# Patient Record
Sex: Male | Born: 1990 | Race: White | Hispanic: No | Marital: Married | Smoking: Never smoker
Health system: Southern US, Community
[De-identification: ages and names within clinical notes are randomized; demographics above are authoritative.]

---
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
## 2019-01-23 ENCOUNTER — Ambulatory Visit: Payer: Self-pay | Admitting: Internal Medicine

## 2019-06-25 ENCOUNTER — Other Ambulatory Visit: Payer: Self-pay | Admitting: *Deleted

## 2019-06-25 DIAGNOSIS — Z20822 Contact with and (suspected) exposure to covid-19: Secondary | ICD-10-CM

## 2019-06-25 NOTE — Addendum Note (Signed)
Addended by: Anacaren Kohan M on: 06/25/2019 08:44 PM   Modules accepted: Orders  

## 2019-06-27 LAB — NOVEL CORONAVIRUS, NAA: SARS-CoV-2, NAA: NOT DETECTED

## 2019-07-31 ENCOUNTER — Other Ambulatory Visit: Payer: Self-pay

## 2019-07-31 ENCOUNTER — Ambulatory Visit (LOCAL_COMMUNITY_HEALTH_CENTER): Payer: Managed Care, Other (non HMO)

## 2019-07-31 DIAGNOSIS — Z0184 Encounter for antibody response examination: Secondary | ICD-10-CM

## 2019-07-31 DIAGNOSIS — Z23 Encounter for immunization: Secondary | ICD-10-CM

## 2019-07-31 NOTE — Progress Notes (Signed)
Presents to Nurse Clinic for Hepatitis B titer Quantitative and Varicella titer. ROI signed and client counseled typcially takes 24 - 48 hours for results to be available and he will receive call when they are available for pick up. Due to deadline for hospital clinical which must be met regarding Hepatitis B vaccine administration if non-immune, client requests Heplisav-B vaccine today as can complete Hepatitis B vaccine series in 28 days if nnon-immune and thus satisfy Hepatitis B requirement. Client counseled that vaccine costis $147.00 and results available in 24 - 48 hours. Client requests to proceed as unable to locate proof or prior Hepatits B vaccination and none in [facility]. Rich Number, RN

## 2019-08-01 LAB — VARICELLA ZOSTER ANTIBODY, IGG: Varicella zoster IgG: 204 index (ref >165)

## 2019-08-01 LAB — HEPATITIS B SURFACE ANTIBODY, QUANTITATIVE: Hepatitis B Surf Ab Quant: 4 m[IU]/mL — ABNORMAL LOW (ref >9.9)

## 2019-08-07 ENCOUNTER — Other Ambulatory Visit: Payer: Self-pay

## 2019-08-07 ENCOUNTER — Ambulatory Visit (LOCAL_COMMUNITY_HEALTH_CENTER): Payer: Managed Care, Other (non HMO)

## 2019-08-07 DIAGNOSIS — Z111 Encounter for screening for respiratory tuberculosis: Secondary | ICD-10-CM

## 2019-08-10 ENCOUNTER — Other Ambulatory Visit: Payer: Self-pay

## 2019-08-10 ENCOUNTER — Ambulatory Visit (LOCAL_COMMUNITY_HEALTH_CENTER): Payer: Managed Care, Other (non HMO)

## 2019-08-10 DIAGNOSIS — Z111 Encounter for screening for respiratory tuberculosis: Secondary | ICD-10-CM

## 2019-08-10 LAB — TB SKIN TEST
Induration: 0 mm
TB Skin Test: NEGATIVE

## 2019-08-21 ENCOUNTER — Ambulatory Visit (LOCAL_COMMUNITY_HEALTH_CENTER): Payer: Managed Care, Other (non HMO)

## 2019-08-21 ENCOUNTER — Other Ambulatory Visit: Payer: Self-pay

## 2019-08-21 DIAGNOSIS — Z111 Encounter for screening for respiratory tuberculosis: Secondary | ICD-10-CM

## 2019-08-24 ENCOUNTER — Ambulatory Visit (LOCAL_COMMUNITY_HEALTH_CENTER): Payer: Managed Care, Other (non HMO)

## 2019-08-24 ENCOUNTER — Other Ambulatory Visit: Payer: Self-pay

## 2019-08-24 DIAGNOSIS — Z111 Encounter for screening for respiratory tuberculosis: Secondary | ICD-10-CM

## 2019-08-24 LAB — TB SKIN TEST
Induration: 0 mm
TB Skin Test: NEGATIVE

## 2019-08-27 ENCOUNTER — Ambulatory Visit: Payer: Self-pay

## 2019-08-27 NOTE — Addendum Note (Signed)
Addended by: Aileen Fass on: 08/27/2019 02:02 PM   Modules accepted: Level of Service

## 2019-08-28 ENCOUNTER — Ambulatory Visit (LOCAL_COMMUNITY_HEALTH_CENTER): Payer: Managed Care, Other (non HMO)

## 2019-08-28 ENCOUNTER — Other Ambulatory Visit: Payer: Self-pay

## 2019-08-28 DIAGNOSIS — Z23 Encounter for immunization: Secondary | ICD-10-CM

## 2019-08-29 ENCOUNTER — Ambulatory Visit: Payer: Self-pay

## 2019-09-27 ENCOUNTER — Other Ambulatory Visit: Payer: Self-pay

## 2019-09-27 DIAGNOSIS — Z20822 Contact with and (suspected) exposure to covid-19: Secondary | ICD-10-CM

## 2019-09-29 LAB — NOVEL CORONAVIRUS, NAA: SARS-CoV-2, NAA: NOT DETECTED

## 2020-09-30 ENCOUNTER — Encounter: Payer: Self-pay | Admitting: Physician Assistant

## 2020-11-18 ENCOUNTER — Telehealth: Payer: Self-pay | Admitting: Family Medicine

## 2020-11-18 NOTE — Telephone Encounter (Signed)
Phone call to pt. Left message on voicemail that RN with [facility] is returning phone call, may have already picked up an earlier message from [place], but typically liver function testing would be something addressed by a person's primary care doctor. If still need assistance by [facility], please call us at [phone].

## 2020-11-18 NOTE — Telephone Encounter (Signed)
Phone call to pt. Left message on voicemail that RN with [facility] is trying to return phone call, not sure if actually trying to reach health dept, but pt would typically be referred to a PCP for requests for LFT bloodwork. Sometimes that testing may be addressed in relation to TB/TB medications. Can call us back at [phone] if still need assistance.

## 2020-11-18 NOTE — Telephone Encounter (Signed)
I need some lab work done. I seen on you website that you can do labs for liver function.

## 2020-11-19 NOTE — Telephone Encounter (Signed)
Phone call to pt. Left message on voicemail that RN with [facility] is returning phone call, if still need assistance please call us at [phone].

## 2021-08-28 ENCOUNTER — Emergency Department: Payer: Managed Care, Other (non HMO)

## 2021-08-28 ENCOUNTER — Other Ambulatory Visit: Payer: Self-pay

## 2021-08-28 DIAGNOSIS — R509 Fever, unspecified: Secondary | ICD-10-CM | POA: Diagnosis present

## 2021-08-28 DIAGNOSIS — R000 Tachycardia, unspecified: Secondary | ICD-10-CM | POA: Insufficient documentation

## 2021-08-28 DIAGNOSIS — J4530 Mild persistent asthma, uncomplicated: Secondary | ICD-10-CM | POA: Diagnosis not present

## 2021-08-28 DIAGNOSIS — Z79899 Other long term (current) drug therapy: Secondary | ICD-10-CM | POA: Diagnosis not present

## 2021-08-28 DIAGNOSIS — J40 Bronchitis, not specified as acute or chronic: Secondary | ICD-10-CM | POA: Diagnosis not present

## 2021-08-28 DIAGNOSIS — Z20822 Contact with and (suspected) exposure to covid-19: Secondary | ICD-10-CM | POA: Diagnosis not present

## 2021-08-28 LAB — COMPREHENSIVE METABOLIC PANEL
ALT: 65 U/L — ABNORMAL HIGH (ref 0–44)
AST: 48 U/L — ABNORMAL HIGH (ref 15–41)
Albumin: 4.4 g/dL (ref 3.5–5.0)
Alkaline Phosphatase: 51 U/L (ref 38–126)
Anion gap: 7 (ref 5–15)
BUN: 12 mg/dL (ref 6–20)
CO2: 26 mmol/L (ref 22–32)
Calcium: 9.4 mg/dL (ref 8.9–10.3)
Chloride: 103 mmol/L (ref 98–111)
Creatinine, Ser: 1.03 mg/dL (ref 0.61–1.24)
GFR, Estimated: >60 mL/min (ref >60)
Glucose, Bld: 115 mg/dL — ABNORMAL HIGH (ref 70–99)
Potassium: 4.2 mmol/L (ref 3.5–5.1)
Sodium: 136 mmol/L (ref 135–145)
Total Bilirubin: 1 mg/dL (ref 0.3–1.2)
Total Protein: 8.4 g/dL — ABNORMAL HIGH (ref 6.5–8.1)

## 2021-08-28 LAB — CBC WITH DIFFERENTIAL/PLATELET
Abs Immature Granulocytes: 0.04 10*3/uL (ref 0.00–0.07)
Basophils Absolute: 0 10*3/uL (ref 0.0–0.1)
Basophils Relative: 0 %
Eosinophils Absolute: 0 10*3/uL (ref 0.0–0.5)
Eosinophils Relative: 0 %
HCT: 42.5 % (ref 39.0–52.0)
Hemoglobin: 14.9 g/dL (ref 13.0–17.0)
Immature Granulocytes: 0 %
Lymphocytes Relative: 6 %
Lymphs Abs: 0.7 10*3/uL (ref 0.7–4.0)
MCH: 30.4 pg (ref 26.0–34.0)
MCHC: 35.1 g/dL (ref 30.0–36.0)
MCV: 86.7 fL (ref 80.0–100.0)
Monocytes Absolute: 1 10*3/uL (ref 0.1–1.0)
Monocytes Relative: 8 %
Neutro Abs: 9.9 10*3/uL — ABNORMAL HIGH (ref 1.7–7.7)
Neutrophils Relative %: 86 %
Platelets: 210 10*3/uL (ref 150–400)
RBC: 4.9 MIL/uL (ref 4.22–5.81)
RDW: 12.5 % (ref 11.5–15.5)
WBC: 11.6 10*3/uL — ABNORMAL HIGH (ref 4.0–10.5)
nRBC: 0 % (ref 0.0–0.2)

## 2021-08-28 LAB — URINALYSIS, COMPLETE (UACMP) WITH MICROSCOPIC
Bacteria, UA: NONE SEEN
Bilirubin Urine: NEGATIVE
Glucose, UA: 50 mg/dL — AB
Hgb urine dipstick: NEGATIVE
Ketones, ur: 5 mg/dL — AB
Leukocytes,Ua: NEGATIVE
Nitrite: NEGATIVE
Protein, ur: >=300 mg/dL — AB
Specific Gravity, Urine: 1.019 (ref 1.005–1.030)
Squamous Epithelial / HPF: NONE SEEN (ref 0–5)
pH: 7 (ref 5.0–8.0)

## 2021-08-28 LAB — LACTIC ACID, PLASMA: Lactic Acid, Venous: 1.5 mmol/L (ref 0.5–1.9)

## 2021-08-28 LAB — PROTIME-INR
INR: 1.1 (ref 0.8–1.2)
Prothrombin Time: 13.9 seconds (ref 11.4–15.2)

## 2021-08-28 NOTE — ED Notes (Signed)
Rainbow, lactic, and 1st set of blood cultures sent to lab

## 2021-08-28 NOTE — ED Triage Notes (Signed)
Pt states is taking two antibiotics for pneumonia. Pt states he has a fever and believes he is becoming septic. Pt states is tachycardia and having tachypnea.

## 2021-08-29 ENCOUNTER — Emergency Department
Admission: EM | Admit: 2021-08-29 | Discharge: 2021-08-29 | Disposition: A | Discharge status: Home / Self Care | Payer: Managed Care, Other (non HMO) | Attending: Emergency Medicine | Admitting: Emergency Medicine

## 2021-08-29 ENCOUNTER — Emergency Department: Payer: Managed Care, Other (non HMO)

## 2021-08-29 DIAGNOSIS — J45909 Unspecified asthma, uncomplicated: Secondary | ICD-10-CM

## 2021-08-29 DIAGNOSIS — J40 Bronchitis, not specified as acute or chronic: Secondary | ICD-10-CM

## 2021-08-29 LAB — RESP PANEL BY RT-PCR (FLU A&B, COVID) ARPGX2
Influenza A by PCR: NEGATIVE
Influenza B by PCR: NEGATIVE
SARS Coronavirus 2 by RT PCR: NEGATIVE

## 2021-08-29 LAB — LACTIC ACID, PLASMA: Lactic Acid, Venous: 1 mmol/L (ref 0.5–1.9)

## 2021-08-29 LAB — PROCALCITONIN: Procalcitonin: 0.11 ng/mL

## 2021-08-29 MED ORDER — ALBUTEROL SULFATE HFA 108 (90 BASE) MCG/ACT IN AERS: 2.0000 | INHALATION_SPRAY | RESPIRATORY_TRACT | 0 refills | Status: AC | PRN

## 2021-08-29 MED ORDER — HYDROCOD POLST-CPM POLST ER 10-8 MG/5ML PO SUER
5.0000 mL | Freq: Once | ORAL | Status: AC
  Administered 2021-08-29: 5 mL via ORAL
  Filled 2021-08-29: qty 5

## 2021-08-29 MED ORDER — PREDNISONE 20 MG PO TABS
60.0000 mg | ORAL_TABLET | Freq: Once | ORAL | Status: AC
  Administered 2021-08-29: 60 mg via ORAL
  Filled 2021-08-29: qty 3

## 2021-08-29 MED ORDER — IOHEXOL 350 MG/ML SOLN
75.0000 mL | Freq: Once | INTRAVENOUS | Status: AC | PRN
  Administered 2021-08-29: 75 mL via INTRAVENOUS

## 2021-08-29 MED ORDER — SODIUM CHLORIDE 0.9 % IV BOLUS
1000.0000 mL | Freq: Once | INTRAVENOUS | Status: AC
  Administered 2021-08-29: 1000 mL via INTRAVENOUS

## 2021-08-29 MED ORDER — PREDNISONE 20 MG PO TABS: ORAL_TABLET | ORAL | 0 refills | Status: DC

## 2021-08-29 MED ORDER — HYDROCOD POLST-CPM POLST ER 10-8 MG/5ML PO SUER: 5.0000 mL | Freq: Two times a day (BID) | ORAL | 0 refills | Status: DC

## 2021-08-29 MED ORDER — KETOROLAC TROMETHAMINE 30 MG/ML IJ SOLN
15.0000 mg | Freq: Once | INTRAMUSCULAR | Status: AC
  Administered 2021-08-29: 15 mg via INTRAVENOUS
  Filled 2021-08-29: qty 1

## 2021-08-29 MED ORDER — COMPRESSOR/NEBULIZER MISC: 1.0000 [IU] | 0 refills | Status: AC | PRN

## 2021-08-29 MED ORDER — ALBUTEROL SULFATE (2.5 MG/3ML) 0.083% IN NEBU: 2.5000 mg | INHALATION_SOLUTION | RESPIRATORY_TRACT | 0 refills | Status: AC | PRN

## 2021-08-29 NOTE — ED Provider Notes (Signed)
[facility] Emergency Department Provider Note   ____________________________________________   Event Date/Time   First MD Initiated Contact with Patient 08/29/21 [phone]     (approximate)  I have reviewed the triage vital signs and the nursing notes.   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  Patient has had a 1 month history of cough and fatigue.  Seen at urgent care 08/25/2021 with negative COVID test and started on Augmentin and Z-Pak for pneumonia.  Patient states overall feeling better but today he had an episode of feeling dizzy, tachycardic and tachypneic when he sat up from a lying position.  Still feeling feverish.  Concern for sepsis.  Denies chest pain, abdominal pain, nausea, vomiting or diarrhea.  Patient is vaccinated against COVID-19.     Past medical history None  There are no problems to display for this patient.   History reviewed. No pertinent surgical history.  Prior to Admission medications   Medication Sig Start Date End Date Taking? Authorizing Provider  albuterol (PROVENTIL) (2.5 MG/3ML) 0.083% nebulizer solution Take 3 mLs (2.5 mg total) by nebulization every 4 (four) hours as needed for wheezing or shortness of breath. 08/29/21  Yes Irean Hong, MD  albuterol (VENTOLIN HFA) 108 (90 Base) MCG/ACT inhaler Inhale 2 puffs into the lungs every 4 (four) hours as needed for wheezing or shortness of breath. 08/29/21  Yes Irean Hong, MD  chlorpheniramine-HYDROcodone [facility] PENNKINETIC ER) 10-8 MG/5ML SUER Take 5 mLs by mouth 2 (two) times daily. 08/29/21  Yes Irean Hong, MD  Nebulizers (COMPRESSOR/NEBULIZER) MISC 1 Units by Does not apply route every 4 (four) hours as needed. 08/29/21  Yes Irean Hong, MD  predniSONE (DELTASONE) 20 MG tablet 3 tablets daily x 4 days 08/29/21  Yes Irean Hong, MD    Allergies Patient has no known  allergies.  History reviewed. No pertinent family history.  Social History    Review of Systems  Constitutional: Positive for generalized weakness, malaise and fever/chills Eyes: No visual changes. ENT: No sore throat. Cardiovascular: Denies chest pain. Respiratory: Positive for cough and shortness of breath. Gastrointestinal: No abdominal pain.  No nausea, no vomiting.  No diarrhea.  No constipation. Genitourinary: Negative for dysuria. Musculoskeletal: Negative for back pain. Skin: Negative for rash. Neurological: Positive for dizziness.  Negative for headaches, focal weakness or numbness.   ____________________________________________   PHYSICAL EXAM:  VITAL SIGNS: ED Triage Vitals [08/28/21 2012]  Enc Vitals Group     BP 127/83     Pulse Rate (!) 104     Resp 18     Temp 100.1 F (37.8 C)     Temp Source Oral     SpO2 96 %     Weight 250 lb (113.4 kg)     Height 5\' 11"  (1.803 m)     Head Circumference      Peak Flow      Pain Score      Pain Loc      Pain Edu?      Excl. in GC?     Constitutional: Alert and oriented. Well appearing and in mild acute distress. Eyes: Conjunctivae are normal. PERRL. EOMI. Head: Atraumatic. Nose: No congestion/rhinnorhea. Mouth/Throat: Mucous membranes are mildly dry. Neck: No stridor.  Supple neck without meningismus. Cardiovascular: Normal rate, regular rhythm. Grossly normal heart sounds.  Good  peripheral circulation. Respiratory: Normal respiratory effort.  No retractions. Lungs CTAB. Gastrointestinal: Soft and nontender. No distention. No abdominal bruits. No CVA tenderness. Musculoskeletal: No lower extremity tenderness nor edema.  No joint effusions. Neurologic:  Normal speech and language. No gross focal neurologic deficits are appreciated.  Skin:  Skin is warm, dry and intact. No rash noted.  No petechiae. Psychiatric: Mood and affect are normal. Speech and behavior are  normal.  ____________________________________________   LABS (all labs ordered are listed, but only abnormal results are displayed)  Labs Reviewed  COMPREHENSIVE METABOLIC PANEL - Abnormal; Notable for the following components:      Result Value   Glucose, Bld 115 (*)    Total Protein 8.4 (*)    AST 48 (*)    ALT 65 (*)    All other components within normal limits  CBC WITH DIFFERENTIAL/PLATELET - Abnormal; Notable for the following components:   WBC 11.6 (*)    Neutro Abs 9.9 (*)    All other components within normal limits  URINALYSIS, COMPLETE (UACMP) WITH MICROSCOPIC - Abnormal; Notable for the following components:   Color, Urine YELLOW (*)    APPearance HAZY (*)    Glucose, UA 50 (*)    Ketones, ur 5 (*)    Protein, ur >=300 (*)    All other components within normal limits  RESP PANEL BY RT-PCR (FLU A&B, COVID) ARPGX2  CULTURE, BLOOD (ROUTINE X 2)  CULTURE, BLOOD (ROUTINE X 2)  LACTIC ACID, PLASMA  LACTIC ACID, PLASMA  PROTIME-INR  PROCALCITONIN   ____________________________________________  EKG  None ____________________________________________  RADIOLOGY I, Carroll Ranney J, personally viewed and evaluated these images (plain radiographs) as part of my medical decision making, as well as reviewing the written report by the radiologist.  ED MD interpretation: No acute cardiopulmonary process; no PE, no pneumonia, likely reactive airways disease/bronchitis  Official radiology report(s): DG Chest 2 View  Result Date: 08/28/2021 CLINICAL DATA:  Suspected sepsis.  Fever.  Tachycardia and tachypnea EXAM: CHEST - 2 VIEW COMPARISON:  None. FINDINGS: The heart and mediastinal contours are within normal limits. Likely subcentimeter calcified granuloma within the right lower lung zone. No focal consolidation. No pulmonary edema. No pleural effusion. No pneumothorax. No acute osseous abnormality. IMPRESSION: No active cardiopulmonary disease. Electronically Signed   By:  Tish Frederickson M.D.   On: 08/28/2021 20:41   CT Angio Chest Pulmonary Embolism (PE) W or WO Contrast  Result Date: 08/29/2021 CLINICAL DATA:  PE suspected, low/intermediate prob, neg D-dimer persistent cough despite abx EXAM: CT ANGIOGRAPHY CHEST WITH CONTRAST TECHNIQUE: Multidetector CT imaging of the chest was performed using the standard protocol during bolus administration of intravenous contrast. Multiplanar CT image reconstructions and MIPs were obtained to evaluate the vascular anatomy. CONTRAST:  29mL OMNIPAQUE IOHEXOL 350 MG/ML SOLN COMPARISON:  Radiograph yesterday. FINDINGS: Cardiovascular: There are no filling defects within the pulmonary arteries to suggest pulmonary embolus. The thoracic aorta is normal in caliber. No dissection or aneurysm. The heart is normal in size. No pericardial effusion. Mediastinum/Nodes: No enlarged mediastinal or hilar lymph nodes no thyroid nodule. Decompressed esophagus. Lungs/Pleura: No acute or focal airspace disease. No pulmonary edema. No pleural effusion. Mild central bronchial thickening. No pulmonary nodule or mass. Upper Abdomen: No acute or unexpected findings. Musculoskeletal: There are no acute or suspicious osseous abnormalities. Review of the MIP images confirms the above findings. IMPRESSION: 1. No pulmonary embolus. 2. Mild central bronchial thickening, can be seen with bronchitis or reactive airways disease. No pneumonia.  Electronically Signed   By: Narda Rutherford M.D.   On: 08/29/2021 02:06    ____________________________________________   PROCEDURES  Procedure(s) performed (including Critical Care):  .1-3 Lead EKG Interpretation Performed by: Irean Hong, MD Authorized by: Irean Hong, MD     Interpretation: normal     ECG rate:  96   ECG rate assessment: normal     Rhythm: sinus rhythm     Ectopy: none     Conduction: normal   Comments:     Patient placed on cardiac monitor to evaluate for  arrhythmias   ____________________________________________   INITIAL IMPRESSION / ASSESSMENT AND PLAN / ED COURSE  As part of my medical decision making, I reviewed the following data within the electronic MEDICAL RECORD NUMBER History obtained from family, Nursing notes reviewed and incorporated, Labs reviewed, Old chart reviewed, Radiograph reviewed, and Notes from prior ED visits     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 Differential includes, but is not limited to, viral syndrome, bronchitis including COPD exacerbation, pneumonia, reactive airway disease including asthma, CHF including exacerbation with or without pulmonary/interstitial edema, pneumothorax, ACS, thoracic trauma, and pulmonary embolism.   Laboratory results demonstrates minimal leukocytosis, unremarkable lactic acid.  We will add procalcitonin, recheck COVID, obtain CTA chest to evaluate for PE and to further characterize pneumonia.  We will perform ambulation trial.  Initiate IV fluid hydration, IV Toradol for body aches, Tussionex for cough.  Will reassess.  Clinical Course as of 08/29/21 0533  Sat Aug 29, 2021  1610 Updated patient of all test results.  Ambulation trial unremarkable.  Will discharge home on prednisone burst, albuterol MDI/nebulizer, Tussionex and patient will follow up with his PCP.  Strict return precautions given.  Patient verbalizes understanding and agrees with plan of care. [JS]    Clinical Course User Index [JS] Irean Hong, MD     ____________________________________________   FINAL CLINICAL IMPRESSION(S) / ED DIAGNOSES  Final diagnoses:  Bronchitis  Mild reactive airways disease, unspecified whether persistent     ED Discharge Orders          Ordered    predniSONE (DELTASONE) 20 MG tablet        08/29/21 0439    chlorpheniramine-HYDROcodone (TUSSIONEX PENNKINETIC ER) 10-8 MG/5ML SUER  2 times daily         08/29/21 0439    albuterol (VENTOLIN HFA) 108 (90 Base) MCG/ACT inhaler  Every 4 hours PRN        08/29/21 0439    albuterol (PROVENTIL) (2.5 MG/3ML) 0.083% nebulizer solution  Every 4 hours PRN        08/29/21 0439    Nebulizers (COMPRESSOR/NEBULIZER) MISC  Every 4 hours PRN        08/29/21 0439             Note:  This document was prepared using Dragon voice recognition software and may include unintentional dictation errors.    Irean Hong, MD 08/29/21 [phone]

## 2021-08-29 NOTE — Discharge Instructions (Signed)
1.  Finish Prednisone 60mg  daily x4 days.  Start your next dose Sunday morning. 2.  You may take Tussionex as needed for cough. 3.  Use Albuterol inhaler or nebulizer every 4 hours as needed for wheezing/difficulty breathing. 4.  Return to the ER for worsening symptoms, persistent vomiting, difficulty breathing or other concerns.

## 2021-08-29 NOTE — ED Notes (Addendum)
Patient ambulated with pulse oximetry: HR 93-109 bpm; O2 Sat 95-97% room air; MD Dolores Frame, J. notified

## 2021-09-02 LAB — CULTURE, BLOOD (ROUTINE X 2)
Culture: NO GROWTH
Special Requests: ADEQUATE

## 2021-09-03 LAB — CULTURE, BLOOD (ROUTINE X 2)
Culture: NO GROWTH
Special Requests: ADEQUATE

## 2021-09-23 ENCOUNTER — Emergency Department
Admission: EM | Admit: 2021-09-23 | Discharge: 2021-09-23 | Disposition: A | Discharge status: Home / Self Care | Payer: Managed Care, Other (non HMO) | Attending: Emergency Medicine | Admitting: Emergency Medicine

## 2021-09-23 ENCOUNTER — Other Ambulatory Visit: Payer: Self-pay

## 2021-09-23 ENCOUNTER — Emergency Department: Payer: Managed Care, Other (non HMO)

## 2021-09-23 DIAGNOSIS — X501XXA Overexertion from prolonged static or awkward postures, initial encounter: Secondary | ICD-10-CM | POA: Diagnosis not present

## 2021-09-23 DIAGNOSIS — S6991XA Unspecified injury of right wrist, hand and finger(s), initial encounter: Secondary | ICD-10-CM | POA: Diagnosis present

## 2021-09-23 DIAGNOSIS — S93401A Sprain of unspecified ligament of right ankle, initial encounter: Secondary | ICD-10-CM | POA: Insufficient documentation

## 2021-09-23 NOTE — ED Triage Notes (Signed)
Pt state s twitsted right ankle. Swelling noted. Pt is ambulatory.

## 2021-09-23 NOTE — ED Provider Notes (Signed)
[facility] Emergency Department Provider Note   ____________________________________________    I have reviewed the triage vital signs and the nursing notes.   HISTORY  Chief Complaint Ankle Pain     HPI Andrew Mckee is a 30 y.o. male who presents with complaints of right ankle pain.  Patient reports he twisted his ankle several days ago, he has been ambulating on it although it is painful.  He reports today it felt worse and he became concerned that perhaps he had fractured it so he presented to the emergency department.  He has taken anti-inflammatories.  He has been icing it.  No past medical history on file.  There are no problems to display for this patient.   No past surgical history on file.  Prior to Admission medications   Medication Sig Start Date End Date Taking? Authorizing Provider  albuterol (PROVENTIL) (2.5 MG/3ML) 0.083% nebulizer solution Take 3 mLs (2.5 mg total) by nebulization every 4 (four) hours as needed for wheezing or shortness of breath. 08/29/21   Irean Hong, MD  albuterol (VENTOLIN HFA) 108 (90 Base) MCG/ACT inhaler Inhale 2 puffs into the lungs every 4 (four) hours as needed for wheezing or shortness of breath. 08/29/21   Irean Hong, MD  chlorpheniramine-HYDROcodone [facility] PENNKINETIC ER) 10-8 MG/5ML SUER Take 5 mLs by mouth 2 (two) times daily. 08/29/21   Irean Hong, MD  Nebulizers (COMPRESSOR/NEBULIZER) MISC 1 Units by Does not apply route every 4 (four) hours as needed. 08/29/21   Irean Hong, MD  predniSONE (DELTASONE) 20 MG tablet 3 tablets daily x 4 days 08/29/21   Irean Hong, MD     Allergies Patient has no known allergies.  No family history on file.  Social History    Review of Systems        Musculoskeletal: Right ankle pain Skin: Negative for rash. Neurological: Negative for headaches     ____________________________________________   PHYSICAL EXAM:  VITAL SIGNS: ED Triage  Vitals  Enc Vitals Group     BP 09/23/21 2043 135/78     Pulse Rate 09/23/21 2043 88     Resp 09/23/21 2043 17     Temp 09/23/21 2043 98.3 F (36.8 C)     Temp src --      SpO2 09/23/21 2043 98 %     Weight 09/23/21 1948 113.4 kg (250 lb)     Height 09/23/21 1948 1.803 m (5\' 11" )     Head Circumference --      Peak Flow --      Pain Score 09/23/21 1948 6     Pain Loc --      Pain Edu? --      Excl. in GC? --      Constitutional: Alert and oriented. No acute distress. Pleasant and interactive Eyes: Conjunctivae are normal.  Head: Atraumatic. Nose: No congestion/rhinnorhea. Mouth/Throat: Mucous membranes are moist.   Cardiovascular: Normal rate, regular rhythm.  Respiratory: Normal respiratory effort.  No retractions. Genitourinary: deferred Musculoskeletal: Mild swelling of the right ankle, no bony tenderness to palpation, no heel tenderness to palpation.  No clear bruising, no discoloration.  No tenderness to the metatarsals Neurologic:  Normal speech and language. No gross focal neurologic deficits are appreciated.   Skin:  Skin is warm, dry and intact. No rash noted.   ____________________________________________   LABS (all labs ordered are listed, but only abnormal results are displayed)  Labs Reviewed - No  data to display ____________________________________________  EKG   ____________________________________________  RADIOLOGY  X-ray viewed by me, questionable avulsion fracture although no significant tenderness at that area ____________________________________________   PROCEDURES  Procedure(s) performed: No  Procedures   Critical Care performed: No ____________________________________________   INITIAL IMPRESSION / ASSESSMENT AND PLAN / ED COURSE  Pertinent labs & imaging results that were available during my care of the patient were reviewed by me and considered in my medical decision making (see chart for details).   Patient with ankle  sprain as detailed above, question avulsion fracture on x-ray although no significant bony tenderness at that site, patient splinted, crutches, weightbearing as tolerated.  Outpatient follow-up with podiatry   ____________________________________________   FINAL CLINICAL IMPRESSION(S) / ED DIAGNOSES  Final diagnoses:  Sprain of right ankle, unspecified ligament, initial encounter      NEW MEDICATIONS STARTED DURING THIS VISIT:  Discharge Medication List as of 09/23/2021  8:32 PM       Note:  This document was prepared using Dragon voice recognition software and may include unintentional dictation errors.    Jene Every, MD 09/23/21 2110

## 2021-10-09 ENCOUNTER — Other Ambulatory Visit (LOCAL_COMMUNITY_HEALTH_CENTER): Payer: Self-pay

## 2021-10-09 ENCOUNTER — Other Ambulatory Visit: Payer: Self-pay

## 2021-10-09 DIAGNOSIS — Z111 Encounter for screening for respiratory tuberculosis: Secondary | ICD-10-CM

## 2021-10-09 NOTE — Progress Notes (Signed)
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 ROI signed. Pt plans to view results on MyChart. RN walked pt to lab. Jerel Shepherd, RN

## 2021-10-17 LAB — QUANTIFERON-TB GOLD PLUS
QuantiFERON Mitogen Value: >10 IU/mL
QuantiFERON Nil Value: 0.01 IU/mL
QuantiFERON TB1 Ag Value: 0.02 IU/mL
QuantiFERON TB2 Ag Value: 0.02 IU/mL
QuantiFERON-TB Gold Plus: NEGATIVE

## 2022-03-11 ENCOUNTER — Encounter: Payer: Self-pay | Admitting: Emergency Medicine

## 2022-03-11 ENCOUNTER — Emergency Department
Admission: EM | Admit: 2022-03-11 | Discharge: 2022-03-12 | Disposition: A | Discharge status: Home / Self Care | Payer: No Typology Code available for payment source | Attending: Emergency Medicine | Admitting: Emergency Medicine

## 2022-03-11 ENCOUNTER — Other Ambulatory Visit: Payer: Self-pay

## 2022-03-11 ENCOUNTER — Emergency Department: Payer: No Typology Code available for payment source

## 2022-03-11 DIAGNOSIS — S6992XA Unspecified injury of left wrist, hand and finger(s), initial encounter: Secondary | ICD-10-CM

## 2022-03-11 DIAGNOSIS — Y92812 Truck as the place of occurrence of the external cause: Secondary | ICD-10-CM | POA: Diagnosis not present

## 2022-03-11 DIAGNOSIS — S63637A Sprain of interphalangeal joint of left little finger, initial encounter: Secondary | ICD-10-CM | POA: Diagnosis not present

## 2022-03-11 DIAGNOSIS — M79645 Pain in left finger(s): Secondary | ICD-10-CM | POA: Diagnosis not present

## 2022-03-11 DIAGNOSIS — Y990 Civilian activity done for income or pay: Secondary | ICD-10-CM | POA: Diagnosis not present

## 2022-03-11 DIAGNOSIS — W231XXA Caught, crushed, jammed, or pinched between stationary objects, initial encounter: Secondary | ICD-10-CM | POA: Diagnosis not present
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
## 2022-03-11 NOTE — Discharge Instructions (Signed)
Please take Tylenol and ibuprofen/Advil for your pain.  It is safe to take them together, or to alternate them every few hours.  Take up to 1000mg  of Tylenol at a time, up to 4 times per day.  Do not take more than 4000 mg of Tylenol in 24 hours.  For ibuprofen, take 400-600 mg, 4-5 times per day. ? ?Keep the splint on your finger to help keep it straight, reduce inflammation and remind yourself not to use it as it heals over the next week or 2. ? ? ?
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
## 2023-07-05 ENCOUNTER — Other Ambulatory Visit: Payer: Self-pay

## 2023-07-05 ENCOUNTER — Emergency Department
Admission: EM | Admit: 2023-07-05 | Discharge: 2023-07-05 | Disposition: A | Discharge status: Home / Self Care | Payer: Managed Care, Other (non HMO) | Attending: Emergency Medicine | Admitting: Emergency Medicine

## 2023-07-05 ENCOUNTER — Emergency Department: Payer: Managed Care, Other (non HMO)

## 2023-07-05 ENCOUNTER — Encounter: Payer: Self-pay | Admitting: Emergency Medicine

## 2023-07-05 DIAGNOSIS — W19XXXA Unspecified fall, initial encounter: Secondary | ICD-10-CM

## 2023-07-05 DIAGNOSIS — W01198A Fall on same level from slipping, tripping and stumbling with subsequent striking against other object, initial encounter: Secondary | ICD-10-CM | POA: Insufficient documentation

## 2023-07-05 DIAGNOSIS — Y9301 Activity, walking, marching and hiking: Secondary | ICD-10-CM | POA: Insufficient documentation

## 2023-07-05 DIAGNOSIS — S022XXA Fracture of nasal bones, initial encounter for closed fracture: Secondary | ICD-10-CM | POA: Insufficient documentation

## 2023-07-05 DIAGNOSIS — R519 Headache, unspecified: Secondary | ICD-10-CM | POA: Diagnosis present

## 2023-07-05 LAB — CBC
HCT: 45.1 % (ref 39.0–52.0)
Hemoglobin: 15.4 g/dL (ref 13.0–17.0)
MCH: 29.7 pg (ref 26.0–34.0)
MCHC: 34.1 g/dL (ref 30.0–36.0)
MCV: 87.1 fL (ref 80.0–100.0)
Platelets: 200 10*3/uL (ref 150–400)
RBC: 5.18 MIL/uL (ref 4.22–5.81)
RDW: 11.9 % (ref 11.5–15.5)
WBC: 5.6 10*3/uL (ref 4.0–10.5)
nRBC: 0 % (ref 0.0–0.2)

## 2023-07-05 LAB — BASIC METABOLIC PANEL
Anion gap: 7 (ref 5–15)
BUN: 11 mg/dL (ref 6–20)
CO2: 28 mmol/L (ref 22–32)
Calcium: 9.8 mg/dL (ref 8.9–10.3)
Chloride: 101 mmol/L (ref 98–111)
Creatinine, Ser: 0.87 mg/dL (ref 0.61–1.24)
GFR, Estimated: >60 mL/min (ref >60)
Glucose, Bld: 79 mg/dL (ref 70–99)
Potassium: 4.2 mmol/L (ref 3.5–5.1)
Sodium: 136 mmol/L (ref 135–145)

## 2023-07-05 MED ORDER — KETOROLAC TROMETHAMINE 15 MG/ML IJ SOLN
15.0000 mg | Freq: Once | INTRAMUSCULAR | Status: AC
  Administered 2023-07-05: 15 mg via INTRAVENOUS
  Filled 2023-07-05: qty 1

## 2023-07-05 MED ORDER — SODIUM CHLORIDE 0.9 % IV BOLUS
1000.0000 mL | Freq: Once | INTRAVENOUS | Status: AC
  Administered 2023-07-05: 1000 mL via INTRAVENOUS

## 2023-07-05 NOTE — ED Provider Notes (Signed)
[facility] Provider Note  Patient Contact: 6:52 PM (approximate)   History   Loss of Consciousness   HPI  Andrew Mckee is a 32 y.o. male with a history of anxiety and ADHD, presents to the emergency department after patient states that he had a possible syncopal episode while at home.  Patient reports that he was awake for 24 hours for his shift and then did not sleep at home when he normally sleeps as he was doing errands around the house.  Patient states that he might have fallen asleep standing up.  He did hit his nose against the floor.  He denies prodrome of chest pain, chest tightness or shortness of breath.  States that she currently now has a headache but did not have a headache prior to possible syncope.  No upper or lower back pain.  Patient has been able to ambulate since incident occurred.  No fever or chills.      Physical Exam   Triage Vital Signs: ED Triage Vitals  Encounter Vitals Group     BP 07/05/23 1744 (!) 150/104     Systolic BP Percentile --      Diastolic BP Percentile --      Pulse Rate 07/05/23 1744 64     Resp 07/05/23 1744 18     Temp 07/05/23 1744 97.9 F (36.6 C)     Temp Source 07/05/23 1744 Oral     SpO2 07/05/23 1744 99 %     Weight 07/05/23 1742 220 lb (99.8 kg)     Height 07/05/23 1742 5\' 11"  (1.803 m)     Head Circumference --      Peak Flow --      Pain Score 07/05/23 1742 7     Pain Loc --      Pain Education --      Exclude from Growth Chart --     Most recent vital signs: Vitals:   07/05/23 1744  BP: (!) 150/104  Pulse: 64  Resp: 18  Temp: 97.9 F (36.6 C)  SpO2: 99%     General: Alert and in no acute distress. Eyes:  PERRL. EOMI. Head: No acute traumatic findings ENT:      Nose: No congestion/rhinnorhea.      Mouth/Throat: Mucous membranes are moist.  Neck: No stridor. No cervical spine tenderness to palpation. Cardiovascular:  Good peripheral perfusion Respiratory: Normal respiratory  effort without tachypnea or retractions. Lungs CTAB. Good air entry to the bases with no decreased or absent breath sounds. Gastrointestinal: Bowel sounds 4 quadrants. Soft and nontender to palpation. No guarding or rigidity. No palpable masses. No distention. No CVA tenderness. Musculoskeletal: Full range of motion to all extremities.  Neurologic:  No gross focal neurologic deficits are appreciated.  Skin: Patient has dried blood at nose.    ED Results / Procedures / Treatments   Labs (all labs ordered are listed, but only abnormal results are displayed) Labs Reviewed  BASIC METABOLIC PANEL  CBC  URINALYSIS, ROUTINE W REFLEX MICROSCOPIC  CBG MONITORING, ED        RADIOLOGY  I personally viewed and evaluated these images as part of my medical decision making, as well as reviewing the written report by the radiologist.  ED Provider Interpretation:  Nondisplaced fracture at nasal bone tip. No acute abnormality on CT of the cervical spine. CT of the head unremarkable.   PROCEDURES:  Critical Care performed: No  ..Laceration Repair  Date/Time: 07/05/2023 7:16  PM  Performed by: Orvil Feil, PA-C Authorized by: Orvil Feil, PA-C   Consent:    Consent obtained:  Verbal   Risks discussed:  Infection and pain Universal protocol:    Procedure explained and questions answered to patient or proxy's satisfaction: yes     Patient identity confirmed:  Verbally with patient Anesthesia:    Anesthesia method:  Topical application Laceration details:    Location:  Face   Face location:  Nose   Length (cm):  1 Pre-procedure details:    Preparation:  Patient was prepped and draped in usual sterile fashion Exploration:    Limited defect created (wound extended): no     Contaminated: no   Treatment:    Amount of cleaning:  Standard Skin repair:    Repair method:  Tissue adhesive Approximation:    Approximation:  Close Repair type:    Repair type:   Simple    MEDICATIONS ORDERED IN ED: Medications  sodium chloride 0.9 % bolus 1,000 mL (1,000 mLs Intravenous New Bag/Given 07/05/23 1919)  ketorolac (TORADOL) 15 MG/ML injection 15 mg (15 mg Intravenous Given 07/05/23 1919)     IMPRESSION / MDM / ASSESSMENT AND PLAN / ED COURSE  I reviewed the triage vital signs and the nursing notes.                              Assessment and plan:  Fall:  Possible Syncope:  32 year old male presents to the emergency department after patient had a possible episode of syncope versus fall in his home after being sleep deprived for approximately 30 hours.  Patient was hypertensive at triage but vital signs otherwise reassuring.  On exam, patient alert, active and nontoxic-appearing   No deficits on neuroexam.  CBC and BMP reassuring.  CTs of the head and cervical spine unremarkable.  Patient has a nondisplaced nasal bone fracture at the tip but is otherwise unremarkable.  EKG indicates normal sinus rhythm without ST segment elevation or other apparent arrhythmia.  Patient has small overlying abrasion over nose and wound was irrigated in the emergency department and Dermabond was applied.  Patient given IV Toradol and normal saline bolus while in the emergency department he overall felt improved.  Recommended follow-up with ENT for nasal bone fracture.  Patient feels comfortable being discharged at this time.  All patient questions were answered.   FINAL CLINICAL IMPRESSION(S) / ED DIAGNOSES   Final diagnoses:  Fall, initial encounter     Rx / DC Orders   ED Discharge Orders     None        Note:  This document was prepared using Dragon voice recognition software and may include unintentional dictation errors.   Pia Mau Sutton, PA-C 07/05/23 2009    Corena Herter, MD 07/06/23 [id number]

## 2023-07-05 NOTE — ED Triage Notes (Signed)
Patient to ED via POV from home after possible syncopal episode. States he was walking and somehow fell on face. Bleeding to nose, headache and c/o neck pain. States only had a few hours of sleep in the past few days.

## 2023-07-05 NOTE — ED Notes (Signed)
See triage notes. Patient had possible syncopal episode. Stated he was walking and somehow fell on his face. Bleeding to the nose and c/o headache and neck pain. Hasn't slept much in the past few days

## 2023-09-09 IMAGING — DX DG HAND COMPLETE 3+V*L*
5 series · 5 of 5 positions shown · non-contrast
Comparison: None.

CLINICAL DATA: Fall, acute pain.

EXAM:
LEFT HAND - COMPLETE 3+ VIEW

[hand ap]
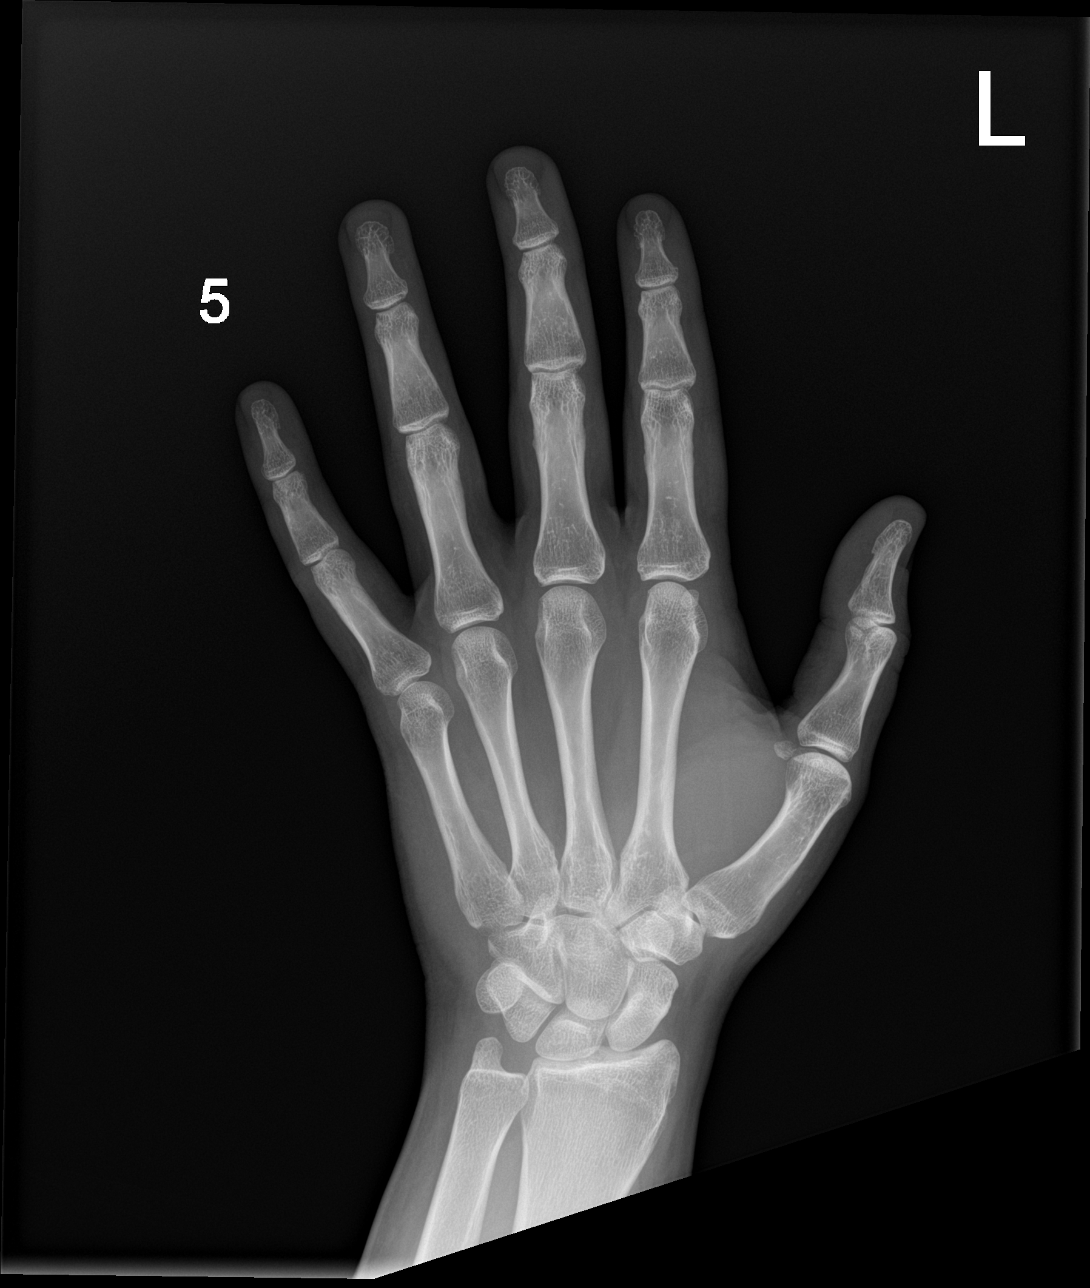

[hand obl]
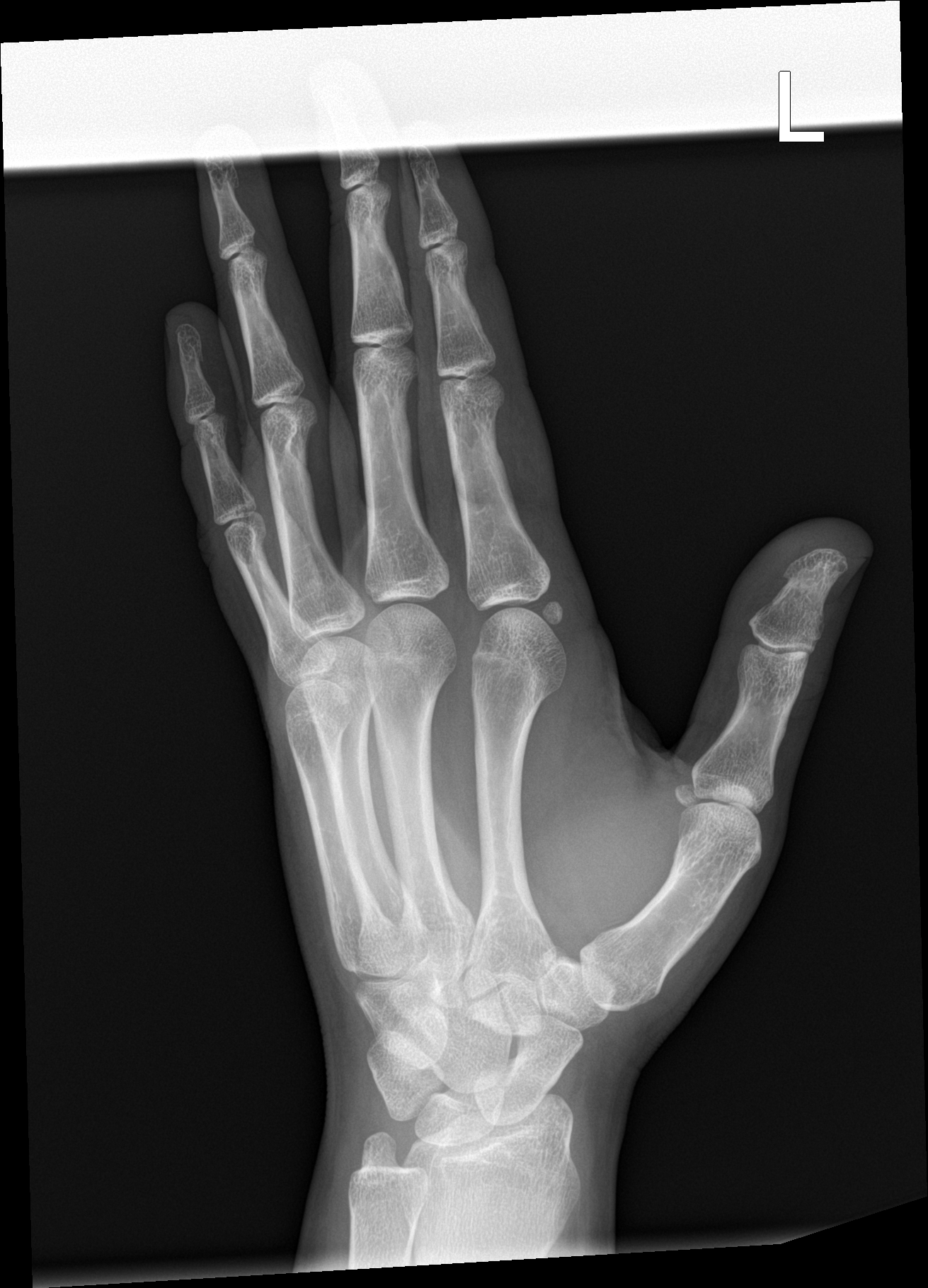

[hand lat (1 of 3)]
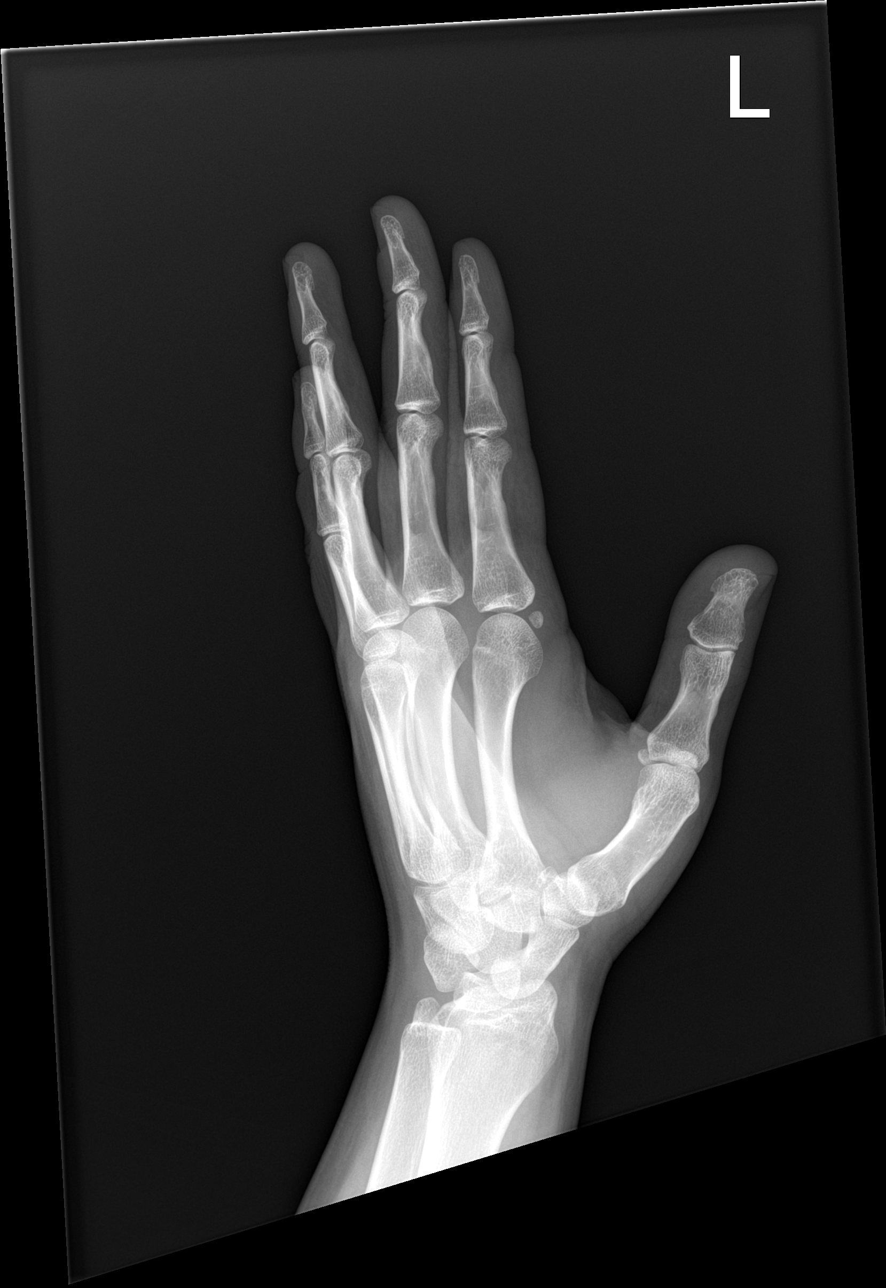

[hand lat (2 of 3)]
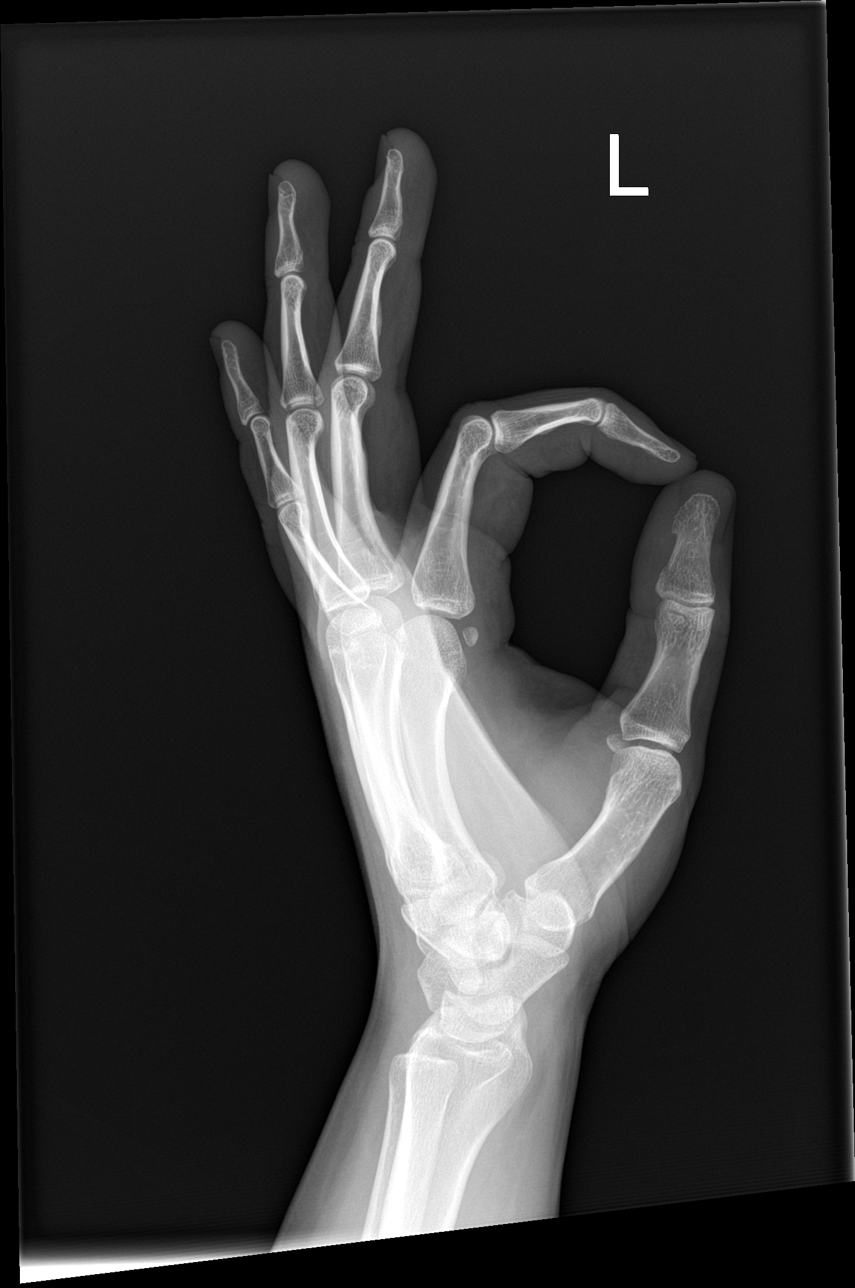

[hand lat (3 of 3)]
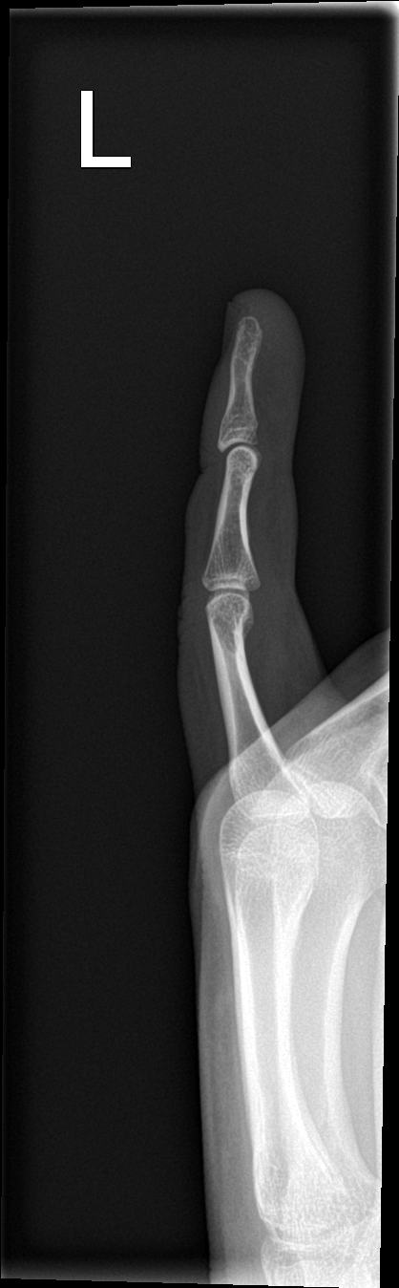

[5 of 5 positions shown; findings below may reference images not displayed]

FINDINGS: There is no evidence of fracture or dislocation. Particularly, no
fracture of the fifth digit. There is no evidence of arthropathy or
other focal bone abnormality. Soft tissues are unremarkable.
IMPRESSION: Negative radiographs of the left hand.

## 2024-10-13 ENCOUNTER — Encounter: Payer: Self-pay | Admitting: Emergency Medicine

## 2024-10-13 ENCOUNTER — Ambulatory Visit: Payer: Self-pay | Admitting: Physician Assistant

## 2024-10-13 ENCOUNTER — Ambulatory Visit (INDEPENDENT_AMBULATORY_CARE_PROVIDER_SITE_OTHER)

## 2024-10-13 ENCOUNTER — Ambulatory Visit: Admission: EM | Admit: 2024-10-13 | Discharge: 2024-10-13 | Disposition: A | Discharge status: Home / Self Care

## 2024-10-13 DIAGNOSIS — M79601 Pain in right arm: Secondary | ICD-10-CM

## 2024-10-13 DIAGNOSIS — M778 Other enthesopathies, not elsewhere classified: Secondary | ICD-10-CM | POA: Diagnosis not present

## 2024-10-13 MED ORDER — IBUPROFEN 800 MG PO TABS: 800.0000 mg | ORAL_TABLET | Freq: Three times a day (TID) | ORAL | 0 refills | Status: AC | PRN

## 2024-10-13 NOTE — ED Provider Notes (Signed)
 [facility]    CSN: [id number] Arrival date & time: 10/13/24  9171      History   Chief Complaint Chief Complaint  Patient presents with   Arm Pain    right    HPI Andrew Mckee is a 33 y.o. male presenting for 2-week history of right elbow pain with radiation into the right forearm and wrist.  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  Also reports digging for various things he finds with his metal detector.  He is right-handed. Increased pain with rotation of elbow. He denies any swelling, bruising or wounds.  He has taken ibuprofen yesterday and it did help.  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  At this time he is not having any significant pain but would like a note for work.  HPI  History reviewed. No pertinent past medical history.  There are no active problems to display for this patient.   History reviewed. No pertinent surgical history.     Home Medications    Prior to Admission medications   Medication Sig Start Date End Date Taking? Authorizing Provider  ibuprofen (ADVIL) 800 MG tablet Take 1 tablet (800 mg total) by mouth every 8 (eight) hours as needed for moderate pain (pain score 4-6). 10/13/24  Yes Arvis Huxley B, PA-C  sertraline (ZOLOFT) 100 MG tablet Take 150 mg by mouth. 11/04/22  Yes [provider]  albuterol  (PROVENTIL ) (2.5 MG/3ML) 0.083% nebulizer solution Take 3 mLs (2.5 mg total) by nebulization every 4 (four) hours as needed for wheezing or shortness of breath. 08/29/21   Sung, Jade J, MD  albuterol  (VENTOLIN  HFA) 108 [phone] Base) MCG/ACT inhaler Inhale 2 puffs into the lungs every 4 (four) hours as needed for wheezing or shortness of breath. 08/29/21   Sung, Jade J, MD  Nebulizers (COMPRESSOR/NEBULIZER) MISC 1 Units by Does not apply route every 4 (four) hours as needed. 08/29/21   Sung, Jade  J, MD    Family History History reviewed. No pertinent family history.  Social History Social History   Tobacco Use   Smoking status: Never   Smokeless tobacco: Never  Vaping Use   Vaping status: Every Day  Substance Use Topics   Alcohol use: Never   Drug use: Never     Allergies   Patient has no known allergies.   Review of Systems Review of Systems  Musculoskeletal:  Positive for arthralgias. Negative for joint swelling.  Skin:  Negative for color change and wound.  Neurological:  Negative for weakness and numbness.     Physical Exam Triage Vital Signs ED Triage Vitals  Encounter Vitals Group     BP 10/13/24 0851 129/84     Girls Systolic BP Percentile --      Girls Diastolic BP Percentile --      Boys Systolic BP Percentile --      Boys Diastolic BP Percentile --      Pulse Rate 10/13/24 0851 61     Resp 10/13/24 0851 16     Temp 10/13/24 0851 98.9 F (37.2 C)     Temp Source 10/13/24 0851 Oral     SpO2 10/13/24 0851 98 %     Weight 10/13/24 0850 220 lb 0.3 oz (99.8 kg)     Height 10/13/24 0850 5' 11 (1.803 m)  Head Circumference --      Peak Flow --      Pain Score 10/13/24 0849 6     Pain Loc --      Pain Education --      Exclude from Growth Chart --    No data found.  Updated Vital Signs BP 129/84 (BP Location: Left Arm)   Pulse 61   Temp 98.9 F (37.2 C) (Oral)   Resp 16   Ht 5' 11 (1.803 m)   Wt 220 lb 0.3 oz (99.8 kg)   SpO2 98%   BMI 30.69 kg/m       Physical Exam Vitals and nursing note reviewed.  Constitutional:      General: He is not in acute distress.    Appearance: Normal appearance. He is well-developed. He is not ill-appearing.  HENT:     Head: Normocephalic and atraumatic.  Eyes:     General: No scleral icterus.    Conjunctiva/sclera: Conjunctivae normal.  Cardiovascular:     Rate and Rhythm: Normal rate.     Pulses: Normal pulses.  Pulmonary:     Effort: Pulmonary effort is normal. No respiratory distress.   Musculoskeletal:     Cervical back: Neck supple.     Comments: RIGHT ARM: No tenderness of elbow, forearm, wrist. No swelling, contusions. Full ROM of elbow and wrist.  Skin:    General: Skin is warm and dry.     Capillary Refill: Capillary refill takes less than 2 seconds.  Neurological:     General: No focal deficit present.     Mental Status: He is alert. Mental status is at baseline.     Motor: No weakness.     Gait: Gait normal.  Psychiatric:        Mood and Affect: Mood normal.        Behavior: Behavior normal.      UC Treatments / Results  Labs (all labs ordered are listed, but only abnormal results are displayed) Labs Reviewed - No data to display  EKG   Radiology No results found.  Procedures Procedures (including critical care time)  Medications Ordered in UC Medications - No data to display  Initial Impression / Assessment and Plan / UC Course  I have reviewed the triage vital signs and the nursing notes.  Pertinent labs & imaging results that were available during my care of the patient were reviewed by me and considered in my medical decision making (see chart for details).   33 year old male presents for right elbow pain over the past couple weeks.  Patient reports over the past several weeks she has been metal detecting and taking up various objects he finds.  Reports swinging his elbow and a lot of repetitive movements of elbow.  Pain does radiate all the way down forearm.  No numbness or weakness.  X-ray was negative on wet read.  Will contact patient if overread differs from my own.  Tendinitis.  Sent 800 mg ibuprofen to pharmacy 3 times daily as needed for inflammation and pain relief.  Encouraged avoiding painful activities especially repetitive elbow motions.  Discussed cryotherapy.  Work note was given for today.  Reviewed return precautions.   Final Clinical Impressions(s) / UC Diagnoses   Final diagnoses:  Right arm pain  Right elbow  tendonitis     Discharge Instructions      ELBOW PAIN: Tendonitis. Stressed avoiding painful activities . Reviewed RICE guidelines. Use medications as directed, including NSAIDs. If no NSAIDs  have been prescribed for you today, you may take Aleve or Motrin over the counter. May use Tylenol in between doses of NSAIDs.  If no improvement in the next 1-2 weeks, f/u with PCP or [facility].    ED Prescriptions     Medication Sig Dispense Auth. Provider   ibuprofen (ADVIL) 800 MG tablet Take 1 tablet (800 mg total) by mouth every 8 (eight) hours as needed for moderate pain (pain score 4-6). 20 tablet Demont Linford B, PA-C      PDMP not reviewed this encounter.   Arvis Jolan NOVAK, PA-C 10/13/24 0930

## 2024-10-13 NOTE — Discharge Instructions (Signed)
 ELBOW PAIN: Tendonitis. Stressed avoiding painful activities . Reviewed RICE guidelines. Use medications as directed, including NSAIDs. If no NSAIDs have been prescribed for you today, you may take Aleve or Motrin over the counter. May use Tylenol in between doses of NSAIDs.  If no improvement in the next 1-2 weeks, f/u with PCP or [facility].

## 2024-10-13 NOTE — ED Triage Notes (Addendum)
 Pt c/o right forearm pain. Starting from his elbow to his wrist. 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 He states the pain is worse when he tried to rotate his arm. He states he had to call out of work today due to the pain. No known injury.
